# Patient Record
Sex: Male | Born: 1950 | Race: Black or African American | Hispanic: No | Marital: Married | Smoking: Current every day smoker
Health system: Southern US, Community
[De-identification: ages and names within clinical notes are randomized; demographics above are authoritative.]

## PROBLEM LIST (undated history)

## (undated) DIAGNOSIS — M545 Low back pain, unspecified: Secondary | ICD-10-CM

## (undated) DIAGNOSIS — K760 Fatty (change of) liver, not elsewhere classified: Secondary | ICD-10-CM

## (undated) DIAGNOSIS — I10 Essential (primary) hypertension: Secondary | ICD-10-CM

## (undated) DIAGNOSIS — M1990 Unspecified osteoarthritis, unspecified site: Secondary | ICD-10-CM

## (undated) DIAGNOSIS — J449 Chronic obstructive pulmonary disease, unspecified: Secondary | ICD-10-CM

## (undated) DIAGNOSIS — R42 Dizziness and giddiness: Secondary | ICD-10-CM

## (undated) DIAGNOSIS — M5416 Radiculopathy, lumbar region: Secondary | ICD-10-CM

---

## 2015-09-12 ENCOUNTER — Emergency Department (HOSPITAL_COMMUNITY): Payer: Medicaid Other

## 2015-09-12 ENCOUNTER — Encounter (HOSPITAL_COMMUNITY): Payer: Self-pay | Admitting: Emergency Medicine

## 2015-09-12 ENCOUNTER — Emergency Department (HOSPITAL_COMMUNITY)
Admission: EM | Admit: 2015-09-12 | Discharge: 2015-09-12 | Disposition: A | Discharge status: Home / Self Care | Payer: Medicaid Other | Attending: Emergency Medicine | Admitting: Emergency Medicine

## 2015-09-12 DIAGNOSIS — M1990 Unspecified osteoarthritis, unspecified site: Secondary | ICD-10-CM | POA: Diagnosis not present

## 2015-09-12 DIAGNOSIS — I10 Essential (primary) hypertension: Secondary | ICD-10-CM | POA: Diagnosis not present

## 2015-09-12 DIAGNOSIS — R1011 Right upper quadrant pain: Secondary | ICD-10-CM | POA: Diagnosis present

## 2015-09-12 DIAGNOSIS — F17210 Nicotine dependence, cigarettes, uncomplicated: Secondary | ICD-10-CM | POA: Diagnosis not present

## 2015-09-12 HISTORY — DX: Essential (primary) hypertension: I10

## 2015-09-12 HISTORY — DX: Unspecified osteoarthritis, unspecified site: M19.90

## 2015-09-12 HISTORY — DX: Dizziness and giddiness: R42

## 2015-09-12 LAB — CBC
HEMATOCRIT: 45.3 % (ref 39.0–52.0)
HEMOGLOBIN: 16.1 g/dL (ref 13.0–17.0)
MCH: 32.3 pg (ref 26.0–34.0)
MCHC: 35.5 g/dL (ref 30.0–36.0)
MCV: 91 fL (ref 78.0–100.0)
Platelets: 281 10*3/uL (ref 150–400)
RBC: 4.98 MIL/uL (ref 4.22–5.81)
RDW: 12.7 % (ref 11.5–15.5)
WBC: 8.5 10*3/uL (ref 4.0–10.5)

## 2015-09-12 LAB — COMPREHENSIVE METABOLIC PANEL
ALBUMIN: 4.3 g/dL (ref 3.5–5.0)
ALT: 24 U/L (ref 17–63)
ANION GAP: 10 (ref 5–15)
AST: 27 U/L (ref 15–41)
Alkaline Phosphatase: 69 U/L (ref 38–126)
BUN: 9 mg/dL (ref 6–20)
CHLORIDE: 103 mmol/L (ref 101–111)
CO2: 24 mmol/L (ref 22–32)
Calcium: 9 mg/dL (ref 8.9–10.3)
Creatinine, Ser: 1.19 mg/dL (ref 0.61–1.24)
GFR calc Af Amer: >60 mL/min (ref >60)
GFR calc non Af Amer: >60 mL/min (ref >60)
Glucose, Bld: 66 mg/dL (ref 65–99)
POTASSIUM: 3.7 mmol/L (ref 3.5–5.1)
Sodium: 137 mmol/L (ref 135–145)
Total Bilirubin: 0.7 mg/dL (ref 0.3–1.2)
Total Protein: 7.9 g/dL (ref 6.5–8.1)

## 2015-09-12 LAB — URINALYSIS, ROUTINE W REFLEX MICROSCOPIC
Bilirubin Urine: NEGATIVE
GLUCOSE, UA: NEGATIVE mg/dL
HGB URINE DIPSTICK: NEGATIVE
Ketones, ur: NEGATIVE mg/dL
LEUKOCYTES UA: NEGATIVE
Nitrite: NEGATIVE
PH: 6 (ref 5.0–8.0)
PROTEIN: NEGATIVE mg/dL
SPECIFIC GRAVITY, URINE: 1.022 (ref 1.005–1.030)

## 2015-09-12 LAB — LIPASE, BLOOD: Lipase: 38 U/L (ref 11–51)

## 2015-09-12 MED ORDER — LISINOPRIL 40 MG PO TABS: 40.0000 mg | ORAL_TABLET | Freq: Every day | ORAL | Status: DC

## 2015-09-12 MED ORDER — HYDROMORPHONE HCL 1 MG/ML IJ SOLN
INTRAMUSCULAR | Status: AC
  Filled 2015-09-12: qty 1

## 2015-09-12 MED ORDER — AMLODIPINE BESYLATE 5 MG PO TABS: 5.0000 mg | ORAL_TABLET | Freq: Every day | ORAL | Status: AC

## 2015-09-12 NOTE — ED Notes (Signed)
Pt states that he has been having rt sided abd pain x a week and a half.  Denies NVD.

## 2015-09-12 NOTE — Discharge Instructions (Signed)
Return to the ER if your pain does not improve or if it worsens. Return if you develop vomiting, fevers, new pain or other concerning symptoms

## 2015-09-12 NOTE — ED Provider Notes (Signed)
CSN: [id number]     Arrival date & time 09/12/15  1050 History   First MD Initiated Contact with Patient 09/12/15 1324     Chief Complaint  Patient presents with  . Abdominal Pain     (Consider location/radiation/quality/duration/timing/severity/associated sxs/prior Treatment) HPI  65 year old male presents with right upper quadrant abdominal pain for the past week and a half. Patient states the pain is constant and feels like a sharp pain. Worse with deep inspiration as well as laying flat. Patient states he chronically drinks 6-8 beers per day. However about a week and a half ago he increased this with liquor while he was on vacation. He is most concerned that he has liver problems now from his chronic and acute alcohol use. Denies any nausea, vomiting, or diarrhea. No urinary symptoms. No back pain or chest pain. Patient states he is chronically short of breath but not worse than typical. No cough or fever. Pain does not worsen with eating. Has taken ibuprofen with good relief since last night. No tylenol use. Current pain is 4/10.  Past Medical History  Diagnosis Date  . Hypertension   . Vertigo   . Arthritis    History reviewed. No pertinent past surgical history. No family history on file. Social History  Substance Use Topics  . Smoking status: Current Every Day Smoker -- 0.50 packs/day    Types: Cigarettes  . Smokeless tobacco: None  . Alcohol Use: No    Review of Systems  Constitutional: Negative for fever.  Respiratory: Negative for cough and shortness of breath.   Cardiovascular: Negative for chest pain.  Gastrointestinal: Positive for abdominal pain. Negative for nausea, vomiting and diarrhea.  Genitourinary: Negative for dysuria and hematuria.  Musculoskeletal: Negative for back pain.  All other systems reviewed and are negative.     Allergies  Review of patient's allergies indicates no known allergies.  Home Medications   Prior to Admission medications   Not  on File   BP 176/92 mmHg  Pulse 58  Temp(Src) 97.9 F (36.6 C) (Oral)  Resp 18  SpO2 100% Physical Exam  Constitutional: He is oriented to person, place, and time. He appears well-developed and well-nourished. No distress.  HENT:  Head: Normocephalic and atraumatic.  Right Ear: External ear normal.  Left Ear: External ear normal.  Nose: Nose normal.  Eyes: Right eye exhibits no discharge. Left eye exhibits no discharge.  Neck: Neck supple.  Cardiovascular: Normal rate, regular rhythm, normal heart sounds and intact distal pulses.   Pulmonary/Chest: Effort normal and breath sounds normal.  Abdominal: Soft. There is tenderness in the right upper quadrant. There is no tenderness at McBurney's point and negative Murphy's sign.  Musculoskeletal: He exhibits no edema.  Neurological: He is alert and oriented to person, place, and time.  Skin: Skin is warm and dry. He is not diaphoretic.  Nursing note and vitals reviewed.   ED Course  Procedures (including critical care time) Labs Review Labs Reviewed  URINALYSIS, ROUTINE W REFLEX MICROSCOPIC (NOT AT [facility]) - Abnormal; Notable for the following:    Color, Urine AMBER (*)    All other components within normal limits  LIPASE, BLOOD  COMPREHENSIVE METABOLIC PANEL  CBC    Imaging Review US Abdomen Limited  09/12/2015  CLINICAL DATA:  Right upper quadrant abdominal pain for 1 week. EXAM: US ABDOMEN LIMITED - RIGHT UPPER QUADRANT COMPARISON:  None. FINDINGS: Gallbladder: No gallstones or wall thickening visualized. No sonographic Murphy sign noted by sonographer. The gallbladder appears  contracted. Common bile duct: Diameter: 4 mm Liver: No focal lesion identified. Within normal limits in parenchymal echogenicity. IMPRESSION: 1. Unremarkable sonographic appearance of the hepatobiliary tree, aside from gallbladder contraction likely due to recent meal. Electronically Signed   By: Gaylyn RongWalter  Liebkemann M.D.   On: 09/12/2015 15:29   I have  personally reviewed and evaluated these images and lab results as part of my medical decision-making.   EKG Interpretation None      MDM   Final diagnoses:  Right upper quadrant abdominal pain  Essential hypertension    Unclear etiology for patient's RUQ abdominal pain. He is stable besides hypertension (is currently off his meds) and exam is benign. Will prescribe his HTN meds after discussion with patient. Repeat exam shows mild RUQ tenderness. With negative U/S, negative LFTs and urine I think the likelihood of serious abdominal pathology is low. Do not think CT needed at this time given benign pain and benign workup thus far. He has already stopped drinking for over 1 week without withdrawal symptoms. Discussed strict return precautions, f/u with a PCP.    Pricilla LovelessScott Lailana Shira, MD 09/12/15 [phone]

## 2015-09-12 NOTE — Progress Notes (Signed)
Patient listed as having Medicaid [place] access without a pcp.  EDCM spoke to patient at bedside.  Patient [facility] as listed on his insurance card but he has not seen them yet.  System updated.

## 2017-01-23 ENCOUNTER — Other Ambulatory Visit: Payer: Self-pay | Admitting: Family Medicine

## 2017-01-23 DIAGNOSIS — Z136 Encounter for screening for cardiovascular disorders: Secondary | ICD-10-CM

## 2017-04-08 ENCOUNTER — Ambulatory Visit
Admission: RE | Admit: 2017-04-08 | Discharge: 2017-04-08 | Disposition: A | Discharge status: Home / Self Care | Payer: Medicaid Other | Source: Ambulatory Visit | Attending: Family Medicine | Admitting: Family Medicine

## 2017-04-08 DIAGNOSIS — Z136 Encounter for screening for cardiovascular disorders: Secondary | ICD-10-CM

## 2017-11-07 ENCOUNTER — Other Ambulatory Visit: Payer: Self-pay | Admitting: Family Medicine

## 2017-11-07 DIAGNOSIS — M5416 Radiculopathy, lumbar region: Secondary | ICD-10-CM

## 2017-11-11 ENCOUNTER — Ambulatory Visit
Admission: RE | Admit: 2017-11-11 | Discharge: 2017-11-11 | Disposition: A | Discharge status: Home / Self Care | Payer: Medicare Other | Source: Ambulatory Visit | Attending: Family Medicine | Admitting: Family Medicine

## 2017-11-11 DIAGNOSIS — M5416 Radiculopathy, lumbar region: Secondary | ICD-10-CM

## 2017-11-12 ENCOUNTER — Other Ambulatory Visit: Payer: Self-pay | Admitting: Nurse Practitioner

## 2017-11-12 DIAGNOSIS — B182 Chronic viral hepatitis C: Secondary | ICD-10-CM

## 2017-11-21 ENCOUNTER — Other Ambulatory Visit: Payer: Medicare Other

## 2017-11-21 ENCOUNTER — Ambulatory Visit
Admission: RE | Admit: 2017-11-21 | Discharge: 2017-11-21 | Disposition: A | Discharge status: Home / Self Care | Payer: Medicare Other | Source: Ambulatory Visit | Attending: Nurse Practitioner | Admitting: Nurse Practitioner

## 2017-11-21 DIAGNOSIS — B182 Chronic viral hepatitis C: Secondary | ICD-10-CM

## 2019-04-20 ENCOUNTER — Encounter: Payer: Medicare HMO | Admitting: Neurology

## 2019-11-17 ENCOUNTER — Other Ambulatory Visit: Payer: Self-pay | Admitting: Family Medicine

## 2019-11-17 ENCOUNTER — Ambulatory Visit
Admission: RE | Admit: 2019-11-17 | Discharge: 2019-11-17 | Disposition: A | Discharge status: Home / Self Care | Payer: Medicaid Other | Source: Ambulatory Visit | Attending: Family Medicine | Admitting: Family Medicine

## 2019-11-17 DIAGNOSIS — M25551 Pain in right hip: Secondary | ICD-10-CM

## 2021-06-22 ENCOUNTER — Other Ambulatory Visit: Payer: Self-pay | Admitting: Family Medicine

## 2021-06-22 DIAGNOSIS — I77811 Abdominal aortic ectasia: Secondary | ICD-10-CM

## 2021-06-25 ENCOUNTER — Other Ambulatory Visit: Payer: Self-pay | Admitting: Family Medicine

## 2021-06-25 DIAGNOSIS — Z122 Encounter for screening for malignant neoplasm of respiratory organs: Secondary | ICD-10-CM

## 2021-06-29 ENCOUNTER — Ambulatory Visit
Admission: RE | Admit: 2021-06-29 | Discharge: 2021-06-29 | Disposition: A | Discharge status: Home / Self Care | Payer: Medicare HMO | Source: Ambulatory Visit | Attending: Family Medicine | Admitting: Family Medicine

## 2021-06-29 DIAGNOSIS — I77811 Abdominal aortic ectasia: Secondary | ICD-10-CM

## 2021-07-17 ENCOUNTER — Other Ambulatory Visit: Payer: Self-pay | Admitting: Nurse Practitioner

## 2021-07-17 DIAGNOSIS — K7402 Hepatic fibrosis, advanced fibrosis: Secondary | ICD-10-CM

## 2021-07-25 ENCOUNTER — Ambulatory Visit
Admission: RE | Admit: 2021-07-25 | Discharge: 2021-07-25 | Disposition: A | Discharge status: Home / Self Care | Payer: Medicare HMO | Source: Ambulatory Visit | Attending: Family Medicine | Admitting: Family Medicine

## 2021-07-25 DIAGNOSIS — Z122 Encounter for screening for malignant neoplasm of respiratory organs: Secondary | ICD-10-CM

## 2021-07-27 ENCOUNTER — Ambulatory Visit
Admission: RE | Admit: 2021-07-27 | Discharge: 2021-07-27 | Disposition: A | Discharge status: Home / Self Care | Payer: Medicare HMO | Source: Ambulatory Visit | Attending: Nurse Practitioner | Admitting: Nurse Practitioner

## 2021-07-27 DIAGNOSIS — K7402 Hepatic fibrosis, advanced fibrosis: Secondary | ICD-10-CM

## 2022-07-14 IMAGING — CT CT CHEST LUNG CANCER SCREENING LOW DOSE W/O CM
1 series · 15 of 31 positions shown, 19 images · non-contrast
Comparison: 03/01/2020 screening chest CT.

CLINICAL DATA: 70-year-old asymptomatic male current smoker with 50
pack-year smoking history.



[Series 2: ldct screening <30 bmi · axial · 0.69mm/px · z∈[-412,-72]mm · 15 of 74 slices shown, 19 images]
[im 3/74  mediastinal]
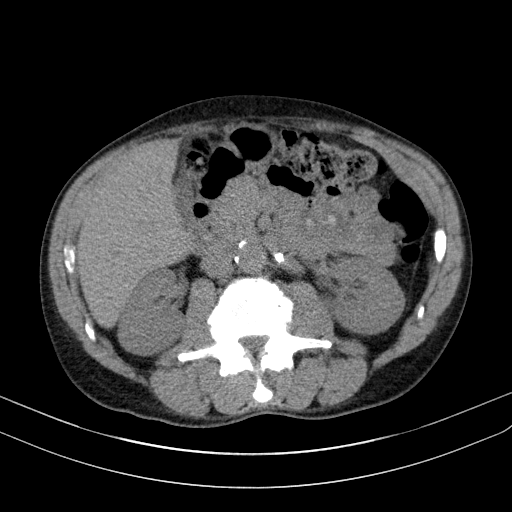
[im 3/74  lung]
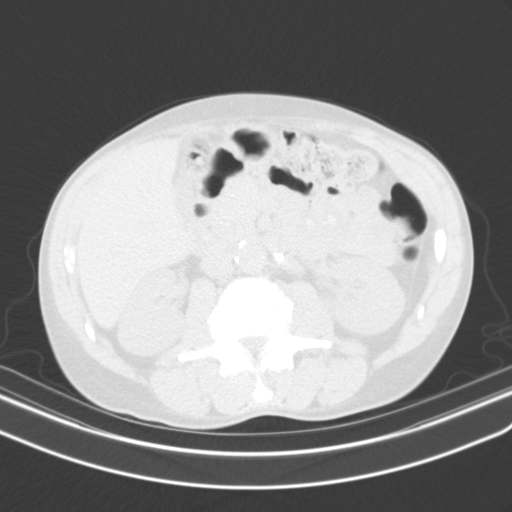
[im 9/74  lung]
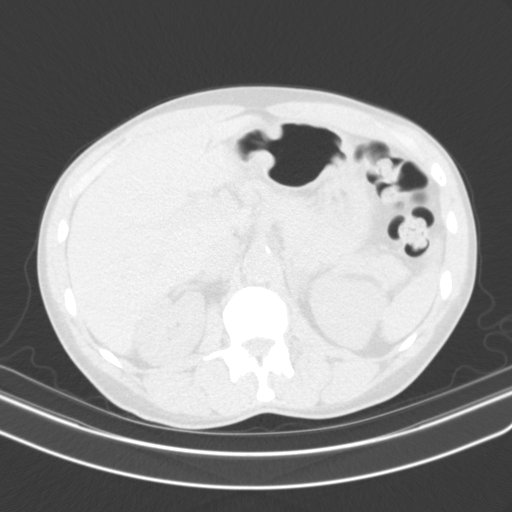
[im 14/74  lung]
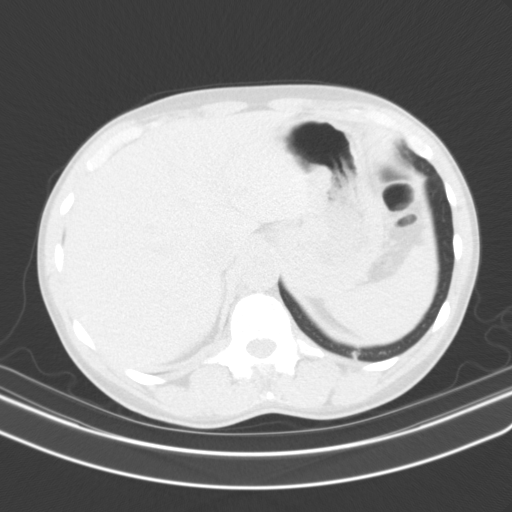
[im 17/74  lung]
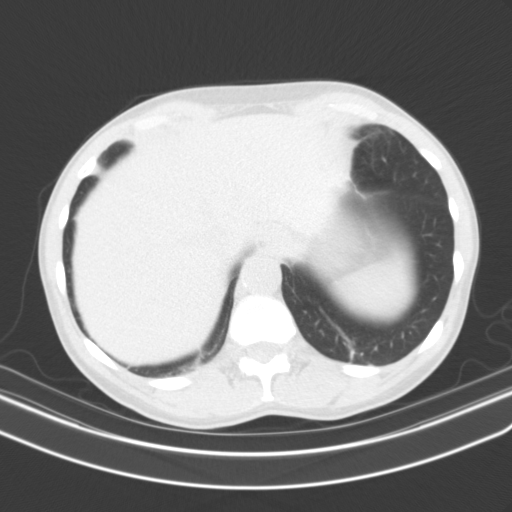
[im 22/74  mediastinal]
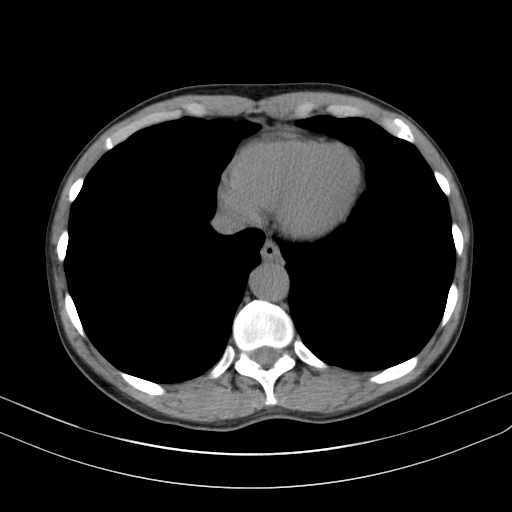
[im 22/74  lung]
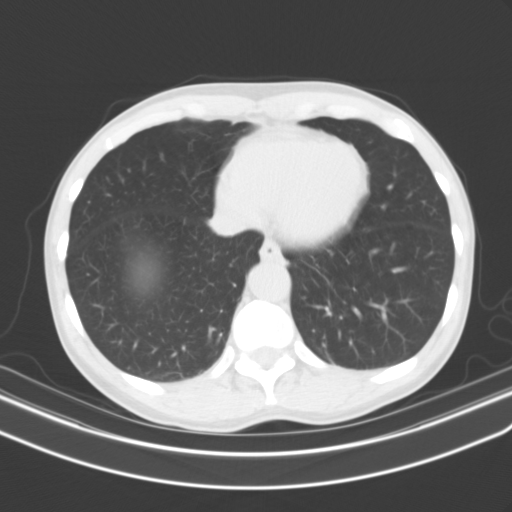
[im 28/74  lung]
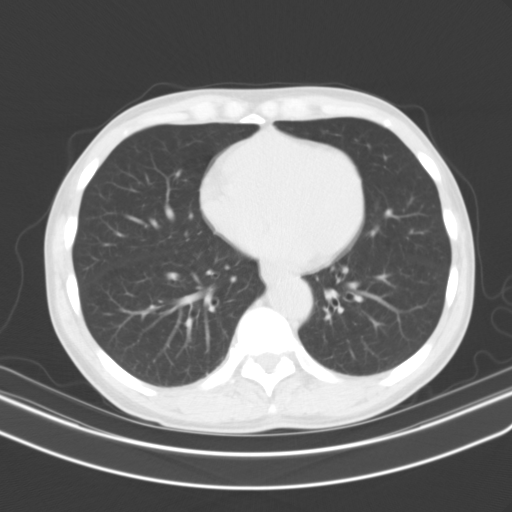
[im 33/74  lung]
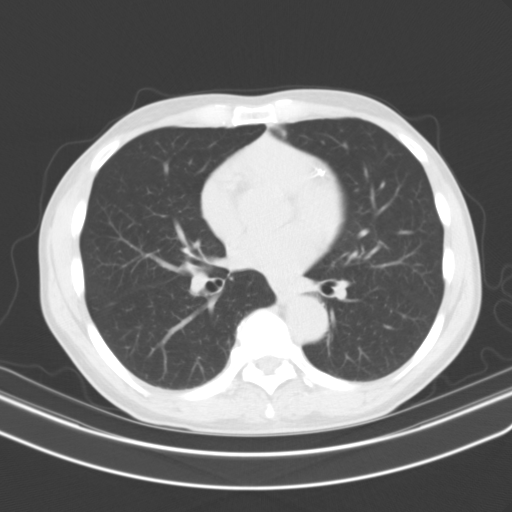
[im 38/74  lung]
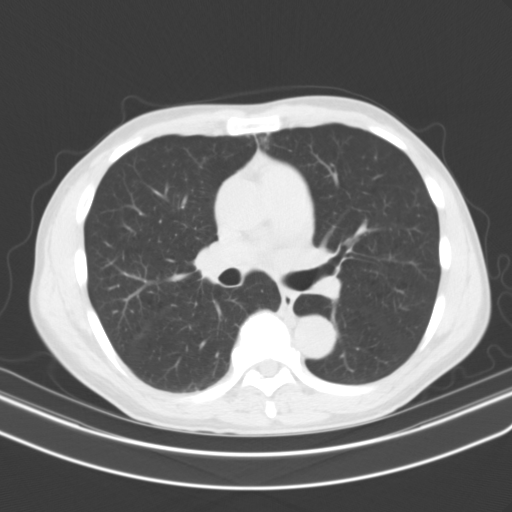
[im 41/74  mediastinal]
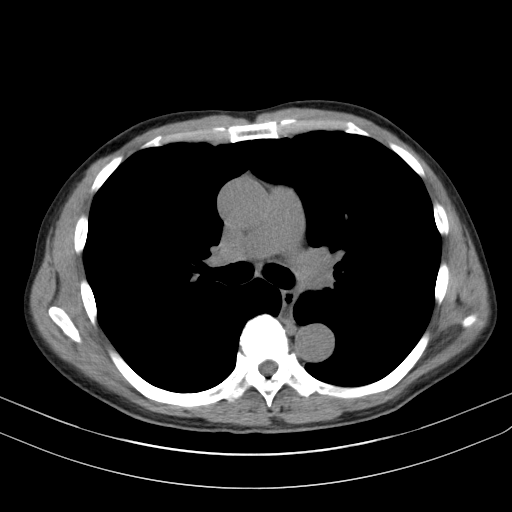
[im 41/74  lung]
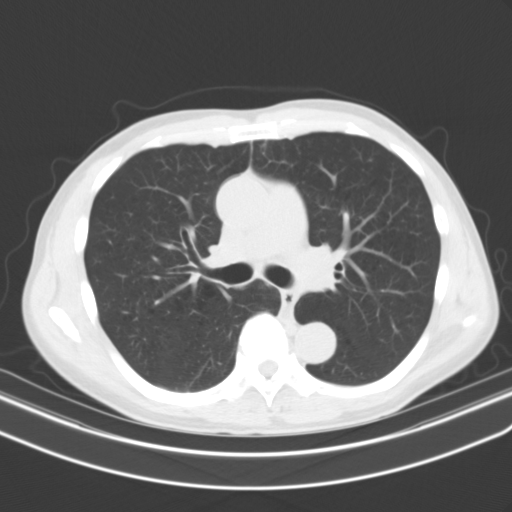
[im 46/74  lung]
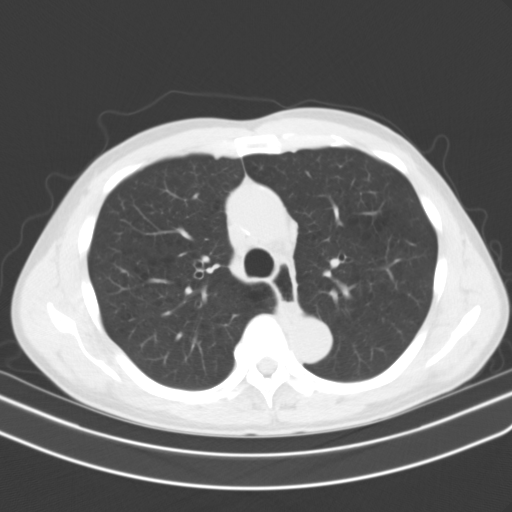
[im 52/74  lung]
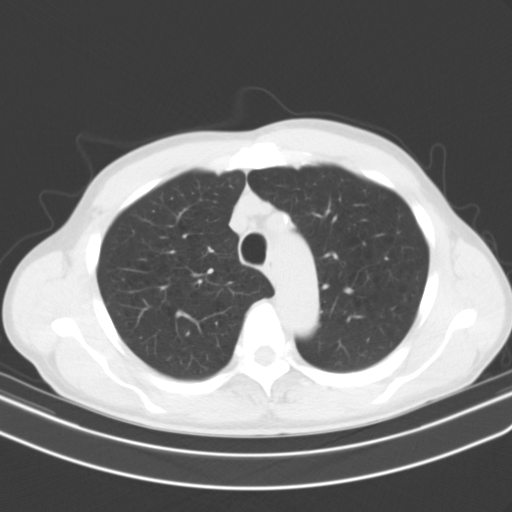
[im 57/74  lung]
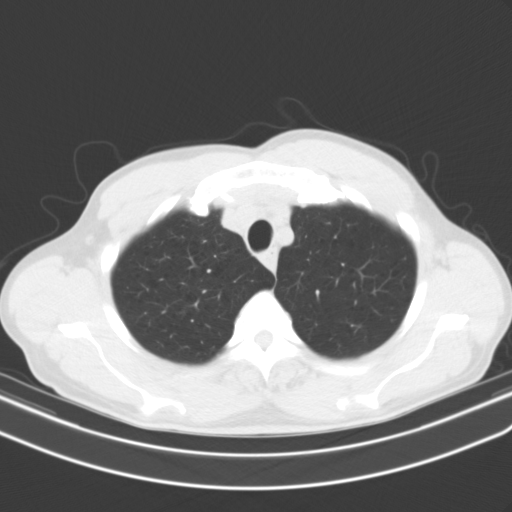
[im 60/74  mediastinal]
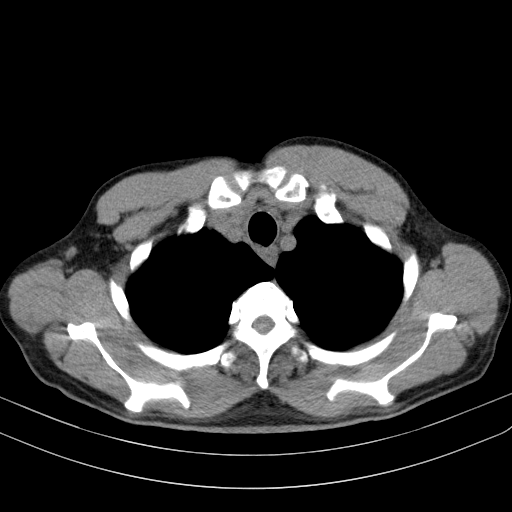
[im 60/74  lung]
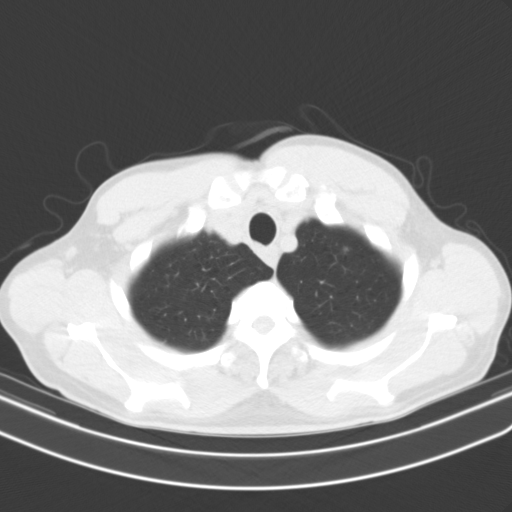
[im 65/74  lung]
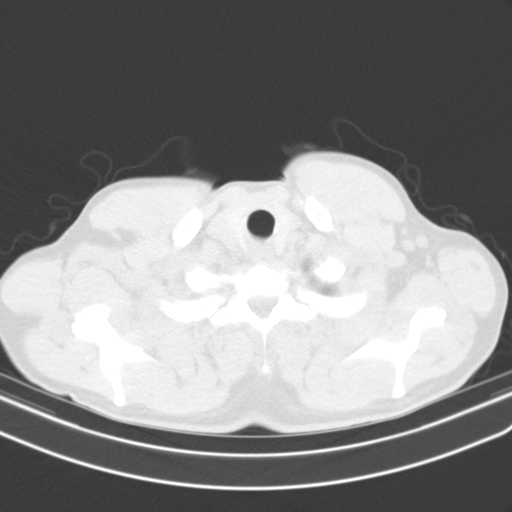
[im 71/74  lung]
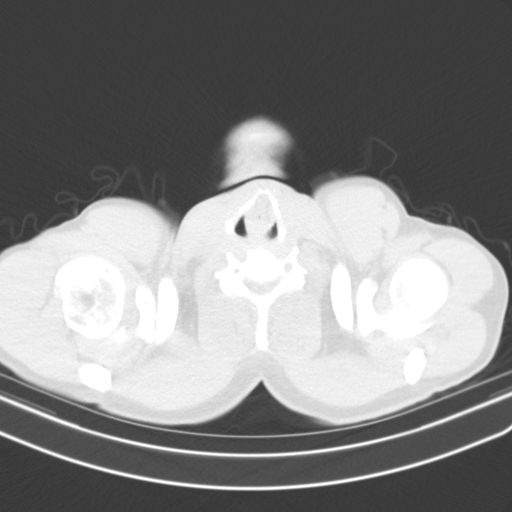

[15 of 31 positions shown; findings below may reference images not displayed]

FINDINGS: Cardiovascular: Normal heart size. No significant pericardial
effusion/thickening. Left anterior descending coronary
atherosclerosis. Atherosclerotic nonaneurysmal thoracic aorta.
Normal caliber pulmonary arteries.

Mediastinum/Nodes: No discrete thyroid nodules. Unremarkable
esophagus. No pathologically enlarged axillary, mediastinal or hilar
lymph nodes, noting limited sensitivity for the detection of hilar
adenopathy on this noncontrast study.

Lungs/Pleura: No pneumothorax. No pleural effusion. Mild-to-moderate
centrilobular emphysema with diffuse bronchial wall thickening. No
acute consolidative airspace disease or lung masses. No significant
growth of previously visualized pulmonary nodules. No new
significant pulmonary nodules.

Upper abdomen: No acute abnormality.

Musculoskeletal: No aggressive appearing focal osseous lesions.
Moderate thoracic spondylosis.
IMPRESSION: 1. Lung-RADS 2, benign appearance or behavior. Continue annual
screening with low-dose chest CT without contrast in 12 months.
2. One vessel coronary atherosclerosis.
3. Aortic Atherosclerosis (B9XCT-EEK.K) and Emphysema (B9XCT-MOW.L).

## 2022-07-16 IMAGING — US US ABDOMEN LIMITED
1 series · 14 of 25 positions shown · non-contrast
Comparison: 11/21/2017

CLINICAL DATA: Abnormal liver function tests

EXAM:
ULTRASOUND ABDOMEN LIMITED RIGHT UPPER QUADRANT

[Series 1: us abdomen limited · 0.14mm/px · 14 of 80 slices shown]
[im 1/80]
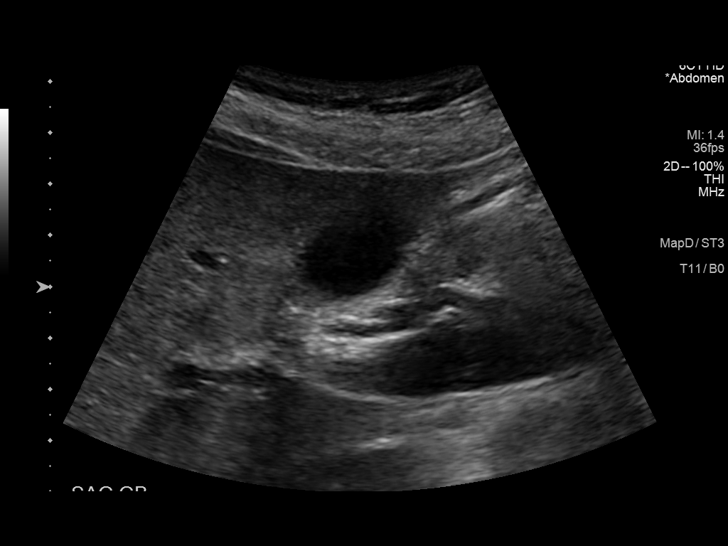
[im 7/80]
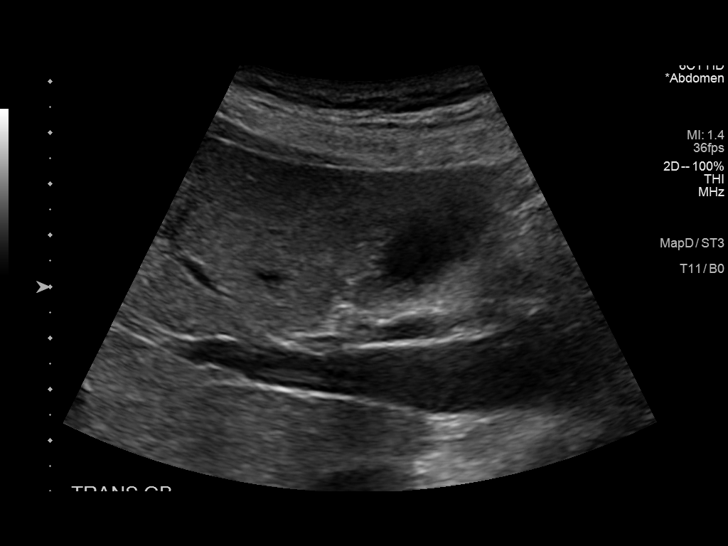
[im 14/80]
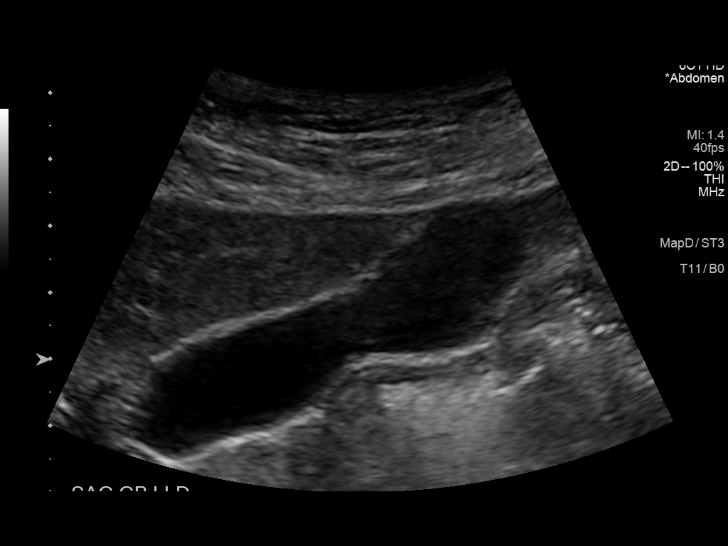
[im 20/80]
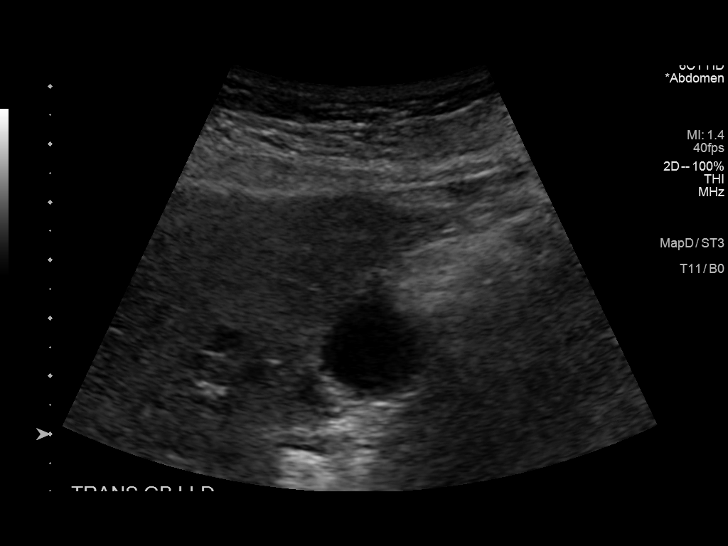
[im 27/80]
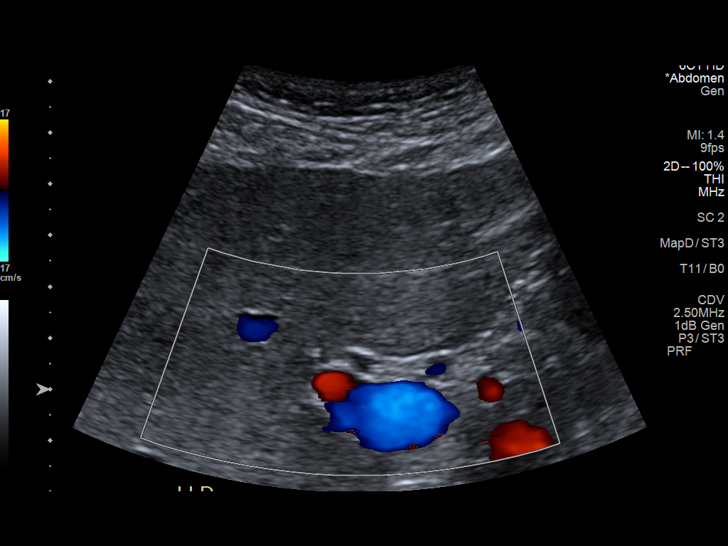
[im 30/80]
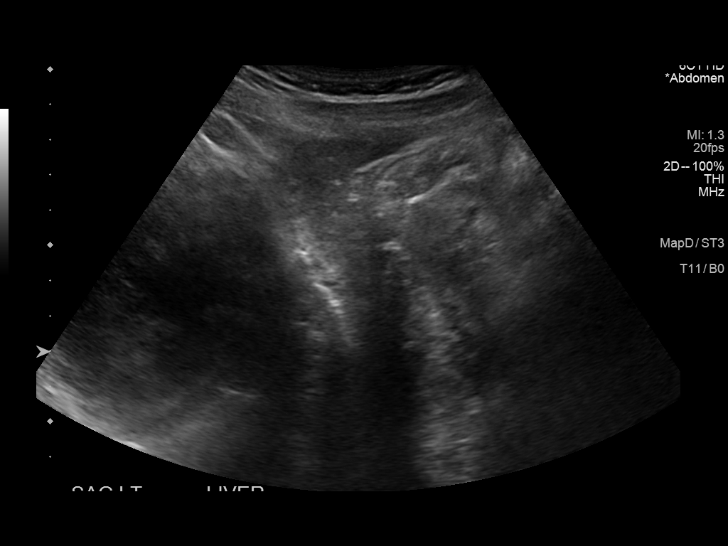
[im 37/80]
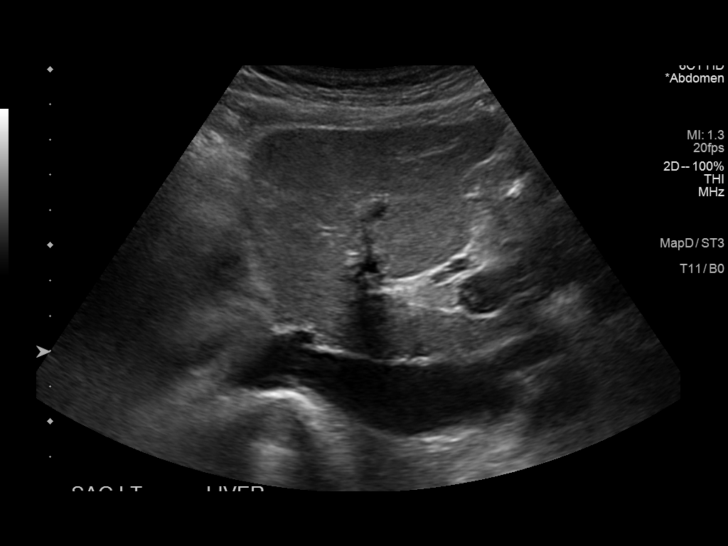
[im 43/80]
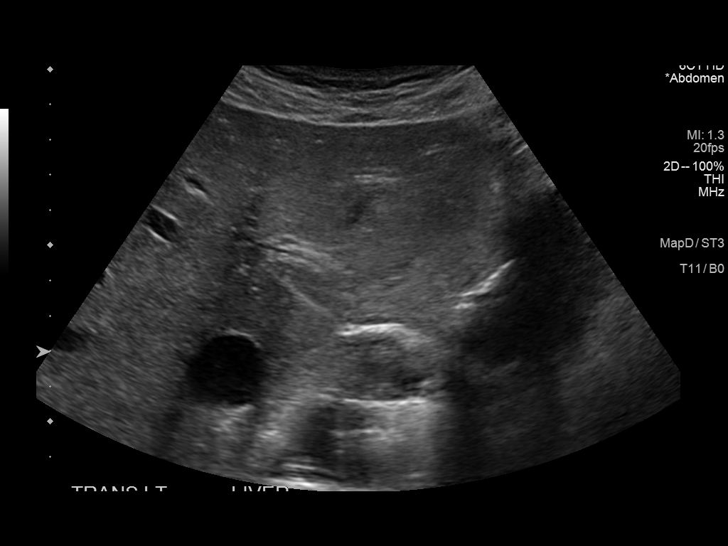
[im 50/80]
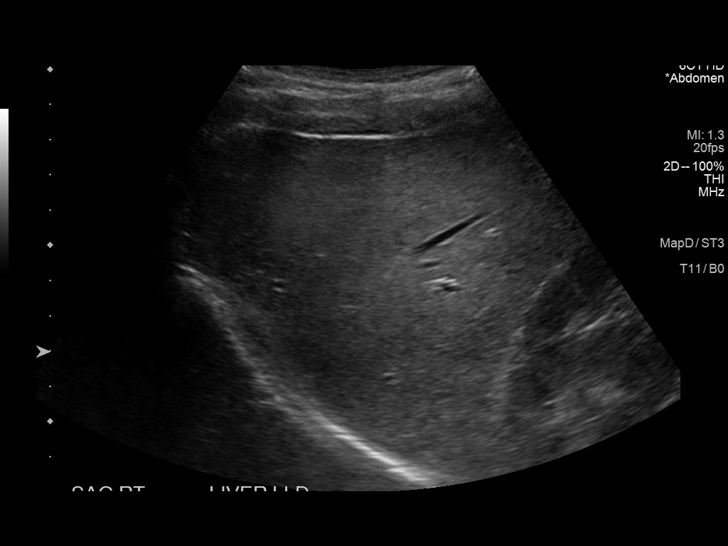
[im 53/80]
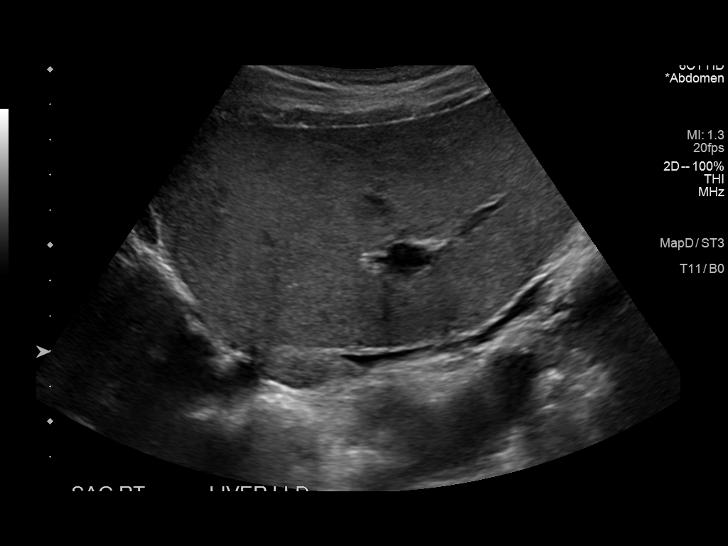
[im 60/80]
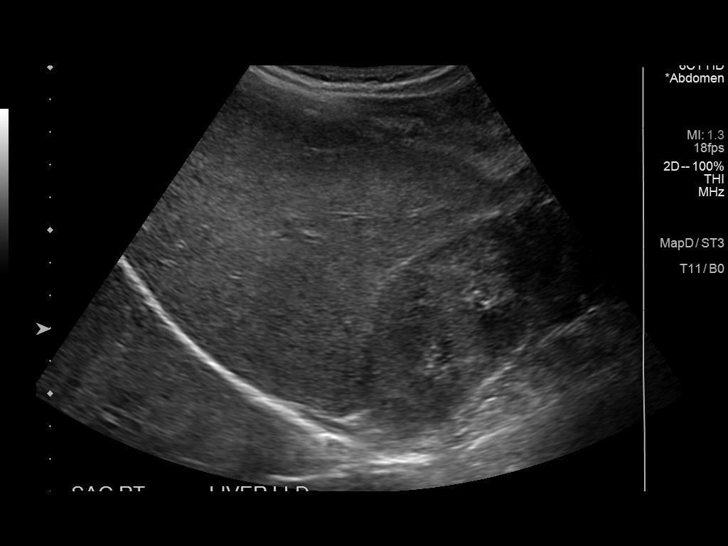
[im 66/80]
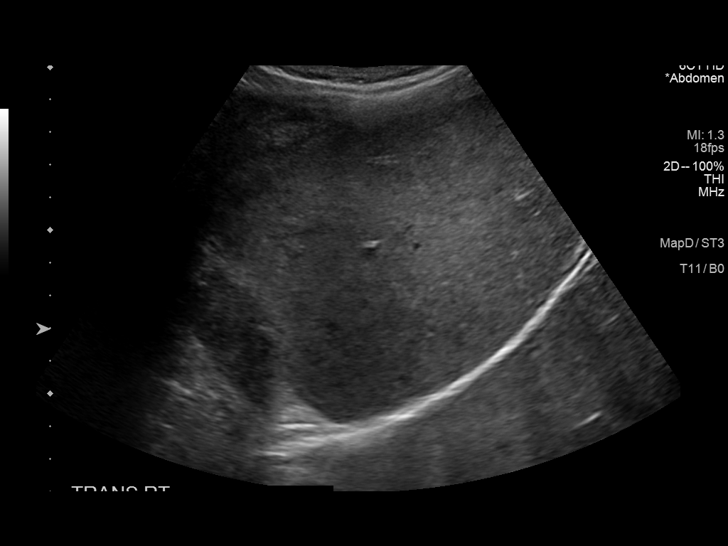
[im 73/80]
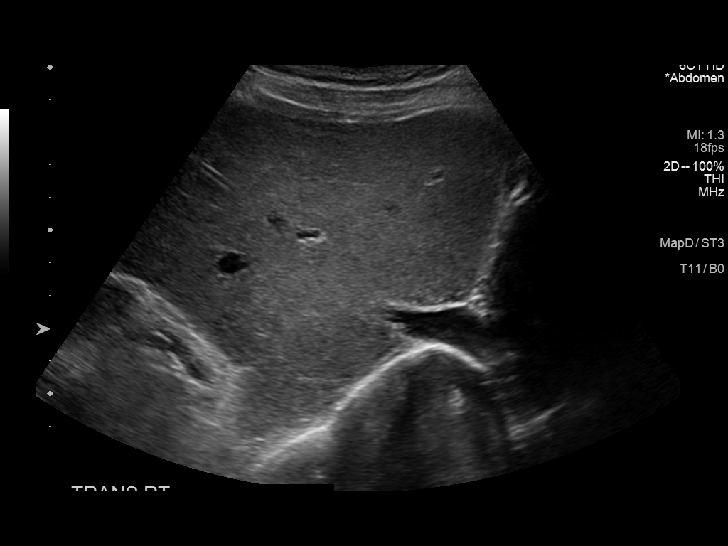
[im 80/80]
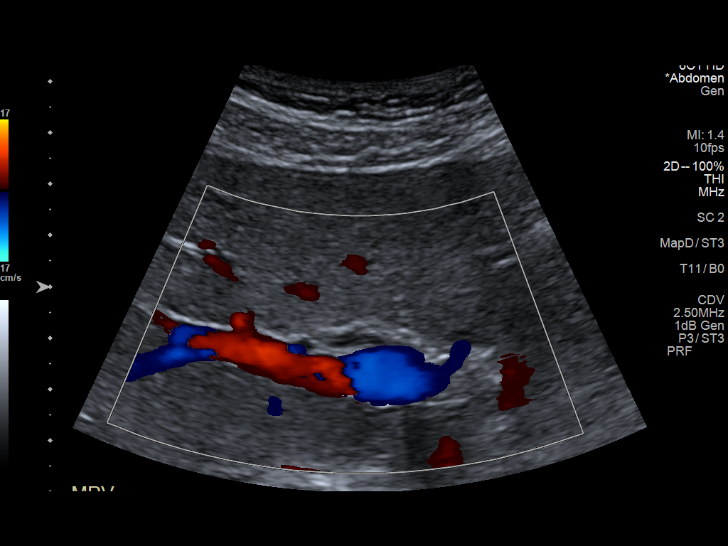

[14 of 25 positions shown; findings below may reference images not displayed]

FINDINGS: Gallbladder:

No gallstones or wall thickening visualized. No sonographic Murphy
sign noted by sonographer.

Common bile duct:

Diameter: 2.7 mm

Liver:

There is inhomogeneous increased echogenicity in the liver
suggesting fatty infiltration. No discrete focal lesions are seen.
There is no significant nodularity in the liver surface. Portal vein
is patent on color Doppler imaging with normal direction of blood
flow towards the liver.

Other: None.
IMPRESSION: Fatty liver. No other sonographic abnormality is seen in the right
upper quadrant.

## 2022-12-27 ENCOUNTER — Ambulatory Visit (HOSPITAL_COMMUNITY)
Admission: EM | Admit: 2022-12-27 | Discharge: 2022-12-27 | Disposition: A | Discharge status: Home / Self Care | Payer: Medicare HMO | Source: Home / Self Care

## 2022-12-27 ENCOUNTER — Encounter (HOSPITAL_COMMUNITY): Payer: Self-pay

## 2022-12-27 DIAGNOSIS — M5441 Lumbago with sciatica, right side: Secondary | ICD-10-CM

## 2022-12-27 DIAGNOSIS — R1011 Right upper quadrant pain: Secondary | ICD-10-CM

## 2022-12-27 DIAGNOSIS — K5901 Slow transit constipation: Secondary | ICD-10-CM | POA: Diagnosis not present

## 2022-12-27 DIAGNOSIS — G8929 Other chronic pain: Secondary | ICD-10-CM

## 2022-12-27 DIAGNOSIS — R1084 Generalized abdominal pain: Secondary | ICD-10-CM | POA: Diagnosis not present

## 2022-12-27 HISTORY — DX: Fatty (change of) liver, not elsewhere classified: K76.0

## 2022-12-27 HISTORY — DX: Chronic obstructive pulmonary disease, unspecified: J44.9

## 2022-12-27 NOTE — ED Provider Notes (Signed)
[facility]    CSN: [id number] Arrival date & time: 12/27/22  0801      History   Chief Complaint Chief Complaint  Patient presents with   Abdominal Pain    HPI Samuel Donaldson is a 72 y.o. male.   72 year old male presents with generalized abdominal pain with some sharp pain to right upper abdomen for the past 2 days. Woke up in the middle of the night with sharp stabbing pain in right upper abdomen. Pain continued but improved yesterday. Today pain is intermittent. Denies any nausea, vomiting, diarrhea or blood in his stool. He does admit that he has not had a bowel movement in 2 days. Usual pattern is daily. Has not taken any laxative or increased fiber to try to initiate a bowel movement. No distinct change in diet. Has not taken any medication for pain.  Other concern is right sided lower back pain that is radiating into his right buttock and down his right leg. Some numbness present. Has history of Sciatica and currently on Mobic daily. Continues to smoke tobacco and marijuana daily. Also usually has 3 to 5 beers daily. His PCP has discussed that he has liver issues that will lead to cirrhosis and advised him to stop alcohol consumption. Other chronic  health issues include HTN which is controlled with Norvasc and COPD which he uses Ellipta inhaler daily.   The history is provided by the patient.    Past Medical History:  Diagnosis Date   Arthritis    COPD (chronic obstructive pulmonary disease) (HCC)    Hepatic steatosis    Hypertension    Vertigo     There are no problems to display for this patient.   History reviewed. No pertinent surgical history.     Home Medications    Prior to Admission medications   Medication Sig Start Date End Date Taking? Authorizing Provider  amLODipine (NORVASC) 5 MG tablet Take 1 tablet (5 mg total) by mouth daily. 09/12/15  Yes Pricilla Loveless, MD  umeclidinium-vilanterol [facility]: [facility] Rhonda W ELLIPTA) 62.5-25 MCG/ACT AEPB Inhale 1 puff  into the lungs daily.   Yes [provider]  meloxicam (MOBIC) 15 MG tablet Take 15 mg by mouth daily.    [provider]    Family History History reviewed. No pertinent family history.  Social History Social History   Tobacco Use   Smoking status: Every Day    Current packs/day: 0.50    Types: Cigarettes  Vaping Use   Vaping status: Never Used  Substance Use Topics   Alcohol use: Yes    Alcohol/week: 28.0 - 35.0 standard drinks of alcohol    Types: 28 - 35 Cans of beer per week    Comment: 3-5 beers daily   Drug use: Yes    Types: Marijuana     Allergies   Lisinopril   Review of Systems Review of Systems  Constitutional:  Negative for activity change, appetite change, chills, fatigue and fever.  Respiratory:  Negative for chest tightness and shortness of breath.   Cardiovascular:  Negative for chest pain and leg swelling.  Gastrointestinal:  Positive for abdominal pain (right side) and constipation. Negative for blood in stool, diarrhea, nausea and vomiting.  Genitourinary:  Negative for difficulty urinating, dysuria and hematuria.  Musculoskeletal:  Positive for arthralgias, back pain and myalgias. Negative for joint swelling, neck pain and neck stiffness.  Skin:  Negative for color change and rash.  Allergic/Immunologic: Negative for environmental allergies and food allergies.  Neurological:  Positive for numbness. Negative for dizziness, tremors, seizures, syncope, facial asymmetry, speech difficulty, light-headedness and headaches.  Hematological:  Negative for adenopathy. Does not bruise/bleed easily.     Physical Exam Triage Vital Signs ED Triage Vitals  Encounter Vitals Group     BP 12/27/22 0816 135/75     Systolic BP Percentile --      Diastolic BP Percentile --      Pulse Rate 12/27/22 0816 (!) 59     Resp 12/27/22 0816 16     Temp 12/27/22 0816 98 F (36.7 C)     Temp Source 12/27/22 0816 Oral     SpO2 12/27/22 0816 98 %      Weight 12/27/22 0816 132 lb (59.9 kg)     Height 12/27/22 0816 5' 6.5" (1.689 m)     Head Circumference --      Peak Flow --      Pain Score 12/27/22 0813 1     Pain Loc --      Pain Education --      Exclude from Growth Chart --    No data found.  Updated Vital Signs BP 135/75 (BP Location: Left Arm)   Pulse (!) 59   Temp 98 F (36.7 C) (Oral)   Resp 16   Ht 5' 6.5" (1.689 m)   Wt 132 lb (59.9 kg)   SpO2 98%   BMI 20.99 kg/m   Visual Acuity Right Eye Distance:   Left Eye Distance:   Bilateral Distance:    Right Eye Near:   Left Eye Near:    Bilateral Near:     Physical Exam Vitals and nursing note reviewed.  Constitutional:      General: He is awake. He is not in acute distress.    Appearance: He is underweight.     Comments: He is sitting in the exam chair in no acute distress.   HENT:     Head: Normocephalic and atraumatic.     Right Ear: Hearing normal.     Left Ear: Hearing normal.  Eyes:     Extraocular Movements: Extraocular movements intact.     Conjunctiva/sclera: Conjunctivae normal.  Cardiovascular:     Rate and Rhythm: Normal rate and regular rhythm.     Heart sounds: Normal heart sounds. No murmur heard.    Comments: Apical heart rate = 62 on exam Pulmonary:     Effort: Pulmonary effort is normal. No respiratory distress.     Breath sounds: Normal air entry. No decreased air movement. Examination of the right-lower field reveals wheezing. Examination of the left-lower field reveals wheezing. Wheezing present. No decreased breath sounds, rhonchi or rales.  Abdominal:     General: Abdomen is flat. Bowel sounds are normal. There is no distension or abdominal bruit.     Palpations: Abdomen is soft. There is no hepatomegaly, splenomegaly or mass.     Tenderness: There is abdominal tenderness in the periumbilical area. There is no right CVA tenderness, left CVA tenderness, guarding or rebound.     Comments: Subjective pain was mostly in right upper  quadrant but tenderness on exam was more central.   Musculoskeletal:        General: No swelling or tenderness. Normal range of motion.     Cervical back: Normal, normal range of motion and neck supple.     Thoracic back: Normal.     Lumbar back: No swelling, spasms or tenderness. Normal range of motion. Negative right straight  leg raise test and negative left straight leg raise test.     Comments: Right lower lumbar area no distinct tenderness present and no rash. Has full range of motion of lower back but pain with extension and full flexion. No distinct neuro deficits noted.   Skin:    General: Skin is warm and dry.     Findings: No rash.  Neurological:     General: No focal deficit present.     Mental Status: He is alert and oriented to person, place, and time.     Sensory: Sensation is intact. No sensory deficit.     Motor: Motor function is intact.     Gait: Gait is intact.     Deep Tendon Reflexes: Reflexes are normal and symmetric.     Comments: No distinct neuro changes noted.   Psychiatric:        Mood and Affect: Mood normal.        Speech: Speech normal.        Behavior: Behavior is cooperative.        Thought Content: Thought content normal.      UC Treatments / Results  Labs (all labs ordered are listed, but only abnormal results are displayed) Labs Reviewed - No data to display  EKG   Radiology No results found.  Procedures Procedures (including critical care time)  Medications Ordered in UC Medications - No data to display  Initial Impression / Assessment and Plan / UC Course  I have reviewed the triage vital signs and the nursing notes.  Pertinent labs & imaging results that were available during my care of the patient were reviewed by me and considered in my medical decision making (see chart for details).     Reviewed with patient that right upper abdominal pain may be due to his liver disease- will need to continue to monitor. Again encouraged to  decrease alcohol consumption. Pain has improved and may also due to mild constipation. Vital signs are stable with no fever or concerning findings. Discussed taking OTC Miralax one dose and then as needed as instructed on bottle to help induce bowel movement. Patient to consider but feels "I think I will have a bowel movement by tonight". Encouraged to drink water and increase fiber in diet. If the abdominal pain gets worse, go to the ER ASAP.  Discussed with patient that he appears to have a flare-up of his Sciatica. Offered patient Rx-strength anti-inflammatory or oral steroids but patient declined. Continue to take Mobic daily as directed. May also trial OTC Lidocaine 4% patch- to apply every 8 to 12 hours as needed. Apply warm compresses to area as needed for comfort. Patient has appointment with his PCP in about 2 weeks- recommend follow-up as planned or earlier if symptoms worsen.   Final Clinical Impressions(s) / UC Diagnoses   Final diagnoses:  Generalized abdominal pain  Right upper quadrant abdominal pain  Slow transit constipation  Chronic right-sided low back pain with right-sided sciatica     Discharge Instructions      Recommend continue to monitor abdominal pain since it is improving. If you do not have a bowel movement by this evening, recommend trial OTC Miralax one dose as directed. Encouraged to drink water to stay hydrated and to decrease alcohol consumption. The right sided abdominal pain can be caused by liver inflammation and with your history of liver disease, you will need to follow-up with your PCP. If the abdominal pain gets worse, go to the  ER ASAP.  Regarding your lower back pain- this appears to be a flare-up of sciatica. Continue taking Mobic daily. Since you do not want other oral medications at this time, recommend try OTC Lidocaine patch- to apply every 8 to 12 hours as needed. Apply warm compresses to area as needed for comfort. Follow-up with your PCP in 2 weeks  as planned or earlier if symptoms worsen.      ED Prescriptions   None    PDMP not reviewed this encounter.   Sudie Grumbling, NP 12/28/22 0930

## 2022-12-27 NOTE — Discharge Instructions (Signed)
Recommend continue to monitor abdominal pain since it is improving. If you do not have a bowel movement by this evening, recommend trial OTC Miralax one dose as directed. Encouraged to drink water to stay hydrated and to decrease alcohol consumption. The right sided abdominal pain can be caused by liver inflammation and with your history of liver disease, you will need to follow-up with your PCP. If the abdominal pain gets worse, go to the ER ASAP.  Regarding your lower back pain- this appears to be a flare-up of sciatica. Continue taking Mobic daily. Since you do not want other oral medications at this time, recommend try OTC Lidocaine patch- to apply every 8 to 12 hours as needed. Follow-up with your PCP in 2 weeks as planned or earlier if symptoms worsen.

## 2022-12-27 NOTE — ED Triage Notes (Signed)
Patient here today with c/o abd pain X 2 days. He has not been able to have a BM in 2 days. Patient was worse yesterday. Has not taken any medication.   Patient is also c/o LB pain on the right side that radiates down right leg X 2 weeks.

## 2022-12-28 ENCOUNTER — Encounter (HOSPITAL_COMMUNITY): Payer: Self-pay | Admitting: Family

## 2023-06-04 ENCOUNTER — Other Ambulatory Visit: Payer: Self-pay | Admitting: Nurse Practitioner

## 2023-06-04 DIAGNOSIS — K7401 Hepatic fibrosis, early fibrosis: Secondary | ICD-10-CM

## 2023-06-05 ENCOUNTER — Other Ambulatory Visit: Payer: Medicare HMO

## 2023-06-11 ENCOUNTER — Other Ambulatory Visit: Payer: Medicare HMO

## 2023-06-18 ENCOUNTER — Ambulatory Visit
Admission: RE | Admit: 2023-06-18 | Discharge: 2023-06-18 | Disposition: A | Discharge status: Home / Self Care | Payer: Medicare HMO | Source: Ambulatory Visit | Attending: Nurse Practitioner | Admitting: Nurse Practitioner

## 2023-06-18 DIAGNOSIS — K7401 Hepatic fibrosis, early fibrosis: Secondary | ICD-10-CM

## 2023-12-11 ENCOUNTER — Other Ambulatory Visit: Payer: Self-pay | Admitting: Nurse Practitioner

## 2023-12-11 DIAGNOSIS — F1010 Alcohol abuse, uncomplicated: Secondary | ICD-10-CM

## 2023-12-11 DIAGNOSIS — K7401 Hepatic fibrosis, early fibrosis: Secondary | ICD-10-CM

## 2024-02-23 NOTE — Progress Notes (Signed)
 [address] - AMBULATORY [facility]  - FAMILY MEDICINE [facility] [address] [place] [address]    Date of service:  02/23/2024  Name:  Samuel Donaldson  Date of Birth:  1951/02/28    SUBJECTIVE   Patient ID: Samuel Donaldson is a 73 y.o. (DOB 1951/01/01) male  Chief Complaint  Patient presents with  . Follow-up  . COPD  . Hypertension    Last visit:  10/02/23.  Hypertension:  Pt is taking medications as instructed, no medication side effects noted.  Home blood pressure monitoring:  Yes Exercise:  yes, walking.  ROS negative for exertional chest pain, ankle edema, palpitations, or decrease in exercise tolerance.  COPD:  This is a chronic problem. Symptoms include:  cough productive of white sputum in moderate amounts and does worsen with exertion walking up stairs.  Patient does smoke. he is using inhalers. Sputum is yellow in moderate amounts.  Patient uses 1 pillows at night.  Patient currently is not on home oxygen therapy.SABRA  Respiratory history: chronic bronchitis and COPD  Chronic lumbar pain for which she occasionally takes tramadol, requesting renewal of Robaxin for muscle spasm, previously tolerated, most recent exacerbation.  BP Readings from Last 3 Encounters:  02/23/24 132/80  12/11/23 137/75  11/24/23 125/79                        Wt Readings from Last 3 Encounters:  02/23/24 58.6 kg (129 lb 4 oz)  12/11/23 5.761 kg (12 lb 11.2 oz)  11/24/23 58.1 kg (128 lb)      Lab Results  Component Value Date   CHOL 221 (H) 07/08/2022   CHOL 227 (H) 06/08/2021   CHOL 200 (H) 06/08/2020   Lab Results  Component Value Date   HDL CANCELED 02/03/2024   HDL 126 07/08/2022   HDL 132 06/08/2021   Lab Results  Component Value Date   LDLCALC CANCELED 02/03/2024   LDLCALC 76 07/08/2022   Lab Results  Component Value Date   TRIG 72 02/03/2024   TRIG 103 07/08/2022   TRIG 55 06/08/2021   No  results found for: CHOLHDL  Hypertension treatment goals reviewed with the patient.  Review of Systems  All other pertinent systems reviewed and are negative.   Social History[1]   The following portions of the patient's history were reviewed and updated as appropriate: allergies, current medications, past family history, past medical history, past social history, past surgical history and problem list.   OBJECTIVE   Vitals:   02/23/24 1631  BP: 132/80  BP Location: Left arm  Patient Position: Sitting  Pulse: 55  Temp: 97.7 F (36.5 C)  TempSrc: Temporal  SpO2: 98%  Weight: 58.6 kg (129 lb 4 oz)  Height: 1.626 m (5' 4)    Body mass index is 22.19 kg/m.  Constitutional: Alert - NAD. Appears well-developed and well nourished. Cardiovascular: Normal rate and rhythm.  No MGR.  No pedal edema. Pulmonary/chest: Effort normal and breath sounds normal. No wheezes. No rales.   ASSESSMENT/PLAN   Problem List Items Addressed This Visit     Chronic obstructive pulmonary disease   Adequate symptom control, continue Anoro Quit smoking      Essential hypertension - Primary   Controlled, BP at goal. Continue current management.       Other Visit Diagnoses       Acute exacerbation of chronic low back pain  Relevant Medications   methocarbamoL (ROBAXIN) 500 mg tablet     Lumbar back pain with radiculopathy affecting right lower extremity       Relevant Medications   methocarbamoL (ROBAXIN) 500 mg tablet       Risks, benefits, and alternatives of the medication(s) and treatment plan(s) were discussed, and he expressed understanding.  No barriers to treatment identified in this visit.   Return in about 4 months (around 06/25/2024) for follow up on chronic conditions, MEDICARE AWV AFTER MAY 29.   Current Outpatient Medications  Medication Instructions  . amlodipine -valsartan 10-160 mg tab 1 tablet, oral, Daily  . Anoro Ellipta 62.5-25 mcg/actuation dsdv 1 puff,  inhalation, Daily  . diclofenac (VOLTAREN) 50 mg, oral, 2 times daily PRN  . gabapentin (NEURONTIN) 300 mg, oral, 3 times daily  . methocarbamoL (ROBAXIN) 500 mg, oral, 3 times daily  . MISCELLANEOUS MEDICATION/PRODUCT Medication/Product Name: Boost. 1 bottle daily  . traMADoL (ULTRAM) 50 mg, oral, Every 8 hours PRN  . triamcinolone acetonide (KENALOG) 0.1 % paste dental, 3 times daily        This document serves as a record of services personally performed by Madelin Brought, MD.  It was created on their behalf by Rolin LOISE Ka, CMA, a trained medical scribe, and Certified Medical Assistant (CMA). During the course of documenting the history, physical exam and medical decision making, I was functioning as a stage manager. The creation of this record is the provider's dictation and/or activities during the visit.  Electronically signed by Rolin LOISE Ka, CMA 02/23/2024 4:29 PM    This document serves as a record of services personally performed by Madelin Brought, MD.  It was created on their behalf by Gwenlyn Cable, CMA, a trained medical scribe, and Certified Medical Assistant (CMA). During the course of documenting the history, physical exam and medical decision making, I was functioning as a stage manager. The creation of this record is the provider's dictation and/or activities during the visit.   Electronically signed by: Gwenlyn Cable, CMA 02/23/2024 7:54 AM    I agree the documentation is accurate and complete.  Electronically signed by: Madelin Rachel Brought, MD 02/24/2024 2:05 PM          [1] Social History Tobacco Use  . Smoking status: Every Day    Current packs/day: 1.00    Average packs/day: 0.5 packs/day for 61.8 years (31.9 ttl pk-yrs)    Types: Cigarettes    Start date: 1964    Passive exposure: Never  . Smokeless tobacco: Never  . Tobacco comments:    06/04/23 - smokes 1ppd  Vaping Use  . Vaping status: Never Used  Substance Use Topics  . Alcohol use: Not  Currently    Alcohol/week: 42.0 standard drinks of alcohol    Types: 42 Cans of beer per week    Comment: recently stopped 09/2023  . Drug use: Yes    Types: Cocaine, Crack cocaine, Heroin, Marijuana    Comment: Smokes marijuana daily; all other drugs since early 2000's

## 2024-03-26 ENCOUNTER — Ambulatory Visit (HOSPITAL_COMMUNITY)
Admission: EM | Admit: 2024-03-26 | Discharge: 2024-03-26 | Disposition: A | Discharge status: Home / Self Care | Attending: Psychiatry | Admitting: Psychiatry

## 2024-03-26 DIAGNOSIS — Z653 Problems related to other legal circumstances: Secondary | ICD-10-CM | POA: Insufficient documentation

## 2024-03-26 DIAGNOSIS — F1290 Cannabis use, unspecified, uncomplicated: Secondary | ICD-10-CM | POA: Diagnosis not present

## 2024-03-26 DIAGNOSIS — F1090 Alcohol use, unspecified, uncomplicated: Secondary | ICD-10-CM | POA: Diagnosis not present

## 2024-03-26 DIAGNOSIS — Z046 Encounter for general psychiatric examination, requested by authority: Secondary | ICD-10-CM | POA: Diagnosis present

## 2024-03-26 NOTE — Discharge Instructions (Addendum)
 Discharge recommendations:   Medications: Patient is to take medications as prescribed. The patient or patient's guardian is to contact a medical professional and/or outpatient provider to address any new side effects that develop. The patient or the patient's guardian should update outpatient providers of any new medications and/or medication changes.    Outpatient Follow up: Please review list of outpatient resources for psychiatry and counseling. Please follow up with your primary care provider for all medical related needs.    Therapy: We recommend that patient participate in individual therapy to address mental health concerns.   Safety:   The following safety precautions should be taken:   No sharp objects. This includes scissors, razors, scrapers, and putty knives.   Chemicals should be removed and locked up.   Medications should be removed and locked up.   Weapons should be removed and locked up. This includes firearms, knives and instruments that can be used to cause injury.   The patient should abstain from use of illicit substances/drugs and abuse of any medications.  If symptoms worsen or do not continue to improve or if the patient becomes actively suicidal or homicidal then it is recommended that the patient return to the closest hospital emergency department, the [facility], or call 911 for further evaluation and treatment. National Suicide Prevention Lifeline 1-800-SUICIDE or (484)020-3816.  About 988 988 offers 24/7 access to trained crisis counselors who can help people experiencing mental health-related distress. People can call or text 988 or chat 988lifeline.org for themselves or if they are worried about a loved one who may need crisis support.    You are encouraged to follow up with [facility] for outpatient treatment.  Walk in/ Open Access Hours: Monday - Friday 8AM - 10AM (To see provider and  therapist) Please arrive by 7:00AM  [facility] [address] [place], [place] [phone]

## 2024-03-26 NOTE — Progress Notes (Signed)
   03/26/24 0948  [facility] Triage Screening (Walk-ins at [facility] only)  How Did You Hear About Us ? Legal System  What Is the Reason for Your Visit/Call Today? Samuel Donaldson is a 38Y male presenting to [facility] vol as a walk-in. Pt states he is here because it was court ordered. Pt states he was charged with indecent exposure and needs papers saying he was seen here. Pt denies SI, HI, AVH. Pt smoked weed and drank 3-4 beers in the last 24hrs.  How Long Has This Been Causing You Problems? <Week  Have You Recently Had Any Thoughts About Hurting Yourself? No  Are You Planning to Commit Suicide/Harm Yourself At This time? No  Have you Recently Had Thoughts About Hurting Someone [name]? No  Are You Planning To Harm Someone At This Time? No  Physical Abuse Denies  Verbal Abuse Denies  Sexual Abuse Denies  Are you currently experiencing any auditory, visual or other hallucinations? No  Have You Used Any Alcohol or Drugs in the Past 24 Hours? Yes  What Did You Use and How Much? Weed & beer  Do you have any current medical co-morbidities that require immediate attention? No  What Do You Feel Would Help You the Most Today?  (Evaluation)  Determination of Need Routine (7 days)  Options For Referral [facility]

## 2024-03-26 NOTE — ED Provider Notes (Signed)
 Behavioral Health Urgent Care Medical Screening Exam  Patient Name: Samuel Donaldson MRN: [id number] Date of Evaluation: 03/26/24 Chief Complaint:  Court ordered to come in Diagnosis:  Final diagnoses:  Alcohol use    History of Present illness: Samuel Donaldson 73 y.o., male patient presented to [facility] as a voluntary walk in unaccompanied with complaints of needing a psychiatric assessment as court ordered due to his past charge of incident exposure years ago. No past psychiatric history. Pt follows up with his PCP through [facility] for medical needs. Samuel Donaldson, is seen face to face by this provider and chart reviewed on 03/26/24.    On evaluation Samuel Donaldson reports that he is currently on probation for indecent exposure which occurred years ago.  He was court ordered to be seen for a psychiatric assessment.  Informed patient that a full and thorough psychiatric assessment will be performed in an outpatient setting as we conduct more crisis assessments in this capacity.  Patient denies having any suicidal ideation, homicidal ideation or hallucinations.  He denies any past psychiatric history of depression, anxiety, schizophrenia or bipolar disorder. Patient is not currently being treated for any psychiatric symptoms.  Patient denies history of inpatient psychiatric treatment.  Patient denies current symptoms of depression, anxiety, mania and psychosis.  He reports sleeping well and having a good appetite.  Patient does endorse marijuana and alcohol use but feels that he will soon be ready to work on remaining sober.  Patient reports smoking marijuana daily last use yesterday.  Patient reports drinking 4-5 beers daily in the evening to wind down , last used yesterday.  He denies having any physical withdrawal symptoms when he does not have access to alcohol.  Patient did not appear to be in active withdrawal during assessment.  Patient does not appear to be acutely intoxicated at this time.   Patient reports that he currently lives with his daughter and is retired from clinical biochemist.  Patient reports that he does check in with his probation officer as required.  Patient denies any sexual inappropriateness since he was charged with indecent exposure years ago.  We discussed patient's readiness to stop using marijuana and alcohol.  He is encouraged to return to [facility] for open access walk-in services to get started with the substance abuse intensive outpatient program.  During evaluation Samuel Donaldson is sitting up lobby in no acute distress.  He is alert & oriented x 4, calm, cooperative and attentive for this assessment.  His mood is euthymic with congruent affect.  He has normal speech, and behavior.  Objectively there is no evidence of psychosis/mania or delusional thinking. Pt does not appear to be responding to internal or external stimuli.  Patient is able to converse coherently, goal directed thoughts, no distractibility, or pre-occupation.  He also denies suicidal/self-harm/homicidal ideation, psychosis, and paranoia.  Patient answered assessment questions appropriately.      Flowsheet Row ED from 03/26/2024 in [facility] from 12/27/2022 in [facility] at [facility] RISK CATEGORY No Risk No Risk    Psychiatric Specialty Exam  Presentation  General Appearance:Appropriate for Environment  Eye Contact:Good  Speech:Clear and Coherent  Speech Volume:Normal  Handedness:Right   Mood and Affect  Mood: Euthymic  Affect: Appropriate   Thought Process  Thought Processes: Coherent; Linear  Descriptions of Associations:Intact  Orientation:Full (Time, Place and Person)  Thought Content:WDL    Hallucinations:None  Ideas of Reference:None  Suicidal Thoughts:No  Homicidal Thoughts:No   Sensorium  Memory: Immediate Good; Recent Good  Judgment: Good  Insight: Good   Executive Functions   Concentration: Good  Attention Span: Good  Recall: Good  Fund of Knowledge: Good  Language: Good   Psychomotor Activity  Psychomotor Activity: Normal   Assets  Assets: Desire for Improvement; Housing; Physical Health; Social Support; Advertising Copywriter; Manufacturing Systems Engineer; Financial Resources/Insurance   Sleep  Sleep: Good  Number of hours:  8   Physical Exam: Physical Exam Vitals and nursing note reviewed.  Constitutional:      Appearance: Normal appearance.  HENT:     Head: Normocephalic.     Nose: Nose normal.  Eyes:     Extraocular Movements: Extraocular movements intact.  Cardiovascular:     Rate and Rhythm: Normal rate.  Pulmonary:     Effort: Pulmonary effort is normal.  Musculoskeletal:        General: Normal range of motion.     Cervical back: Normal range of motion.  Neurological:     General: No focal deficit present.     Mental Status: He is alert and oriented to person, place, and time.    Review of Systems  Constitutional: Negative.   HENT: Negative.    Eyes: Negative.   Respiratory: Negative.    Cardiovascular: Negative.   Gastrointestinal: Negative.   Genitourinary: Negative.   Musculoskeletal: Negative.   Neurological: Negative.   Endo/Heme/Allergies: Negative.   Psychiatric/Behavioral:  Positive for substance abuse.    Blood pressure (!) 147/88, pulse 81, temperature 98.6 F (37 C), temperature source Oral, resp. rate 18, SpO2 97%. There is no height or weight on file to calculate BMI.  Musculoskeletal: Strength & Muscle Tone: within normal limits Gait & Station: normal Patient leans: N/A   [facility] MSE Discharge Disposition for Follow up and Recommendations: Based on my evaluation the patient does not appear to have an emergency medical condition and can be discharged with resources and follow up care in outpatient services for Substance Abuse Intensive Outpatient Program  Patient is discharged from [facility].  Patient denied current psychiatric concerns.  We did discuss his alcohol and marijuana use and patient expressed wanting to begin working on his sobriety in order to better his health.  Patient provided with multiple outpatient substance abuse resources and is encouraged to call [facility] for substance abuse intensive outpatient program.  Patient will follow-up with PCP and probation officer as scheduled.  Alan JAYSON Mcardle, NP 03/26/2024, 10:58 AM

## 2024-03-27 ENCOUNTER — Encounter: Payer: Self-pay | Admitting: Emergency Medicine

## 2024-03-27 ENCOUNTER — Ambulatory Visit
Admission: EM | Admit: 2024-03-27 | Discharge: 2024-03-27 | Disposition: A | Discharge status: Home / Self Care | Attending: Emergency Medicine | Admitting: Emergency Medicine

## 2024-03-27 DIAGNOSIS — M5441 Lumbago with sciatica, right side: Secondary | ICD-10-CM | POA: Diagnosis not present

## 2024-03-27 DIAGNOSIS — G8929 Other chronic pain: Secondary | ICD-10-CM

## 2024-03-27 HISTORY — DX: Low back pain, unspecified: M54.50

## 2024-03-27 HISTORY — DX: Radiculopathy, lumbar region: M54.16

## 2024-03-27 MED ORDER — NAPROXEN 375 MG PO TABS: 375.0000 mg | ORAL_TABLET | Freq: Two times a day (BID) | ORAL | 0 refills | Status: AC

## 2024-03-27 MED ORDER — BACLOFEN 5 MG PO TABS: 5.0000 mg | ORAL_TABLET | Freq: Every evening | ORAL | 0 refills | Status: AC | PRN
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
## 2024-03-27 NOTE — Discharge Instructions (Addendum)
 Naproxen  -- 1 tablet, twice daily, always taken with food (this is pain medicine)  Baclofen  -- 1 tablet at bedtime as needed (this is muscle relaxer)  Please go to the emergency department if symptoms worsen. Call your pain specialist for follow up  Happy early birthday!
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
